# Patient Record
Sex: Male | Born: 1965 | Race: White | Hispanic: No | Marital: Single | Smoking: Current every day smoker
Health system: Southern US, Community
[De-identification: ages and names within clinical notes are randomized; demographics above are authoritative.]

## PROBLEM LIST (undated history)

## (undated) DIAGNOSIS — G009 Bacterial meningitis, unspecified: Secondary | ICD-10-CM

---

## 2016-03-11 ENCOUNTER — Other Ambulatory Visit: Payer: Self-pay | Admitting: Family Medicine

## 2016-03-11 DIAGNOSIS — R1011 Right upper quadrant pain: Secondary | ICD-10-CM

## 2016-03-14 ENCOUNTER — Ambulatory Visit
Admission: RE | Admit: 2016-03-14 | Discharge: 2016-03-14 | Disposition: A | Discharge status: Home / Self Care | Payer: BLUE CROSS/BLUE SHIELD | Source: Ambulatory Visit | Attending: Family Medicine | Admitting: Family Medicine

## 2016-03-14 DIAGNOSIS — R1011 Right upper quadrant pain: Secondary | ICD-10-CM

## 2016-03-14 DIAGNOSIS — K760 Fatty (change of) liver, not elsewhere classified: Secondary | ICD-10-CM | POA: Insufficient documentation

## 2020-03-15 DIAGNOSIS — R519 Headache, unspecified: Secondary | ICD-10-CM | POA: Diagnosis not present

## 2020-03-15 DIAGNOSIS — G4489 Other headache syndrome: Secondary | ICD-10-CM | POA: Diagnosis not present

## 2020-03-15 DIAGNOSIS — Z20822 Contact with and (suspected) exposure to covid-19: Secondary | ICD-10-CM | POA: Diagnosis not present

## 2020-03-15 DIAGNOSIS — K529 Noninfective gastroenteritis and colitis, unspecified: Secondary | ICD-10-CM | POA: Diagnosis not present

## 2020-03-16 ENCOUNTER — Emergency Department
Admission: EM | Admit: 2020-03-16 | Discharge: 2020-03-16 | Disposition: A | Discharge status: Home / Self Care | Payer: 59 | Attending: Emergency Medicine | Admitting: Emergency Medicine

## 2020-03-16 ENCOUNTER — Emergency Department: Payer: 59

## 2020-03-16 ENCOUNTER — Other Ambulatory Visit: Payer: Self-pay

## 2020-03-16 DIAGNOSIS — G43909 Migraine, unspecified, not intractable, without status migrainosus: Secondary | ICD-10-CM | POA: Insufficient documentation

## 2020-03-16 DIAGNOSIS — R519 Headache, unspecified: Secondary | ICD-10-CM | POA: Diagnosis not present

## 2020-03-16 LAB — COMPREHENSIVE METABOLIC PANEL
ALT: 38 U/L (ref 0–44)
AST: 41 U/L (ref 15–41)
Albumin: 4.2 g/dL (ref 3.5–5.0)
Alkaline Phosphatase: 69 U/L (ref 38–126)
Anion gap: 10 (ref 5–15)
BUN: 14 mg/dL (ref 6–20)
CO2: 26 mmol/L (ref 22–32)
Calcium: 8.9 mg/dL (ref 8.9–10.3)
Chloride: 103 mmol/L (ref 98–111)
Creatinine, Ser: 0.93 mg/dL (ref 0.61–1.24)
GFR, Estimated: >60 mL/min (ref >60)
Glucose, Bld: 132 mg/dL — ABNORMAL HIGH (ref 70–99)
Potassium: 4.1 mmol/L (ref 3.5–5.1)
Sodium: 139 mmol/L (ref 135–145)
Total Bilirubin: 1.1 mg/dL (ref 0.3–1.2)
Total Protein: 7.6 g/dL (ref 6.5–8.1)

## 2020-03-16 LAB — CBC WITH DIFFERENTIAL/PLATELET
Abs Immature Granulocytes: 0.04 10*3/uL (ref 0.00–0.07)
Basophils Absolute: 0 10*3/uL (ref 0.0–0.1)
Basophils Relative: 0 %
Eosinophils Absolute: 0.1 10*3/uL (ref 0.0–0.5)
Eosinophils Relative: 1 %
HCT: 44.6 % (ref 39.0–52.0)
Hemoglobin: 15.9 g/dL (ref 13.0–17.0)
Immature Granulocytes: 0 %
Lymphocytes Relative: 15 %
Lymphs Abs: 1.7 10*3/uL (ref 0.7–4.0)
MCH: 32.1 pg (ref 26.0–34.0)
MCHC: 35.7 g/dL (ref 30.0–36.0)
MCV: 90.1 fL (ref 80.0–100.0)
Monocytes Absolute: 0.5 10*3/uL (ref 0.1–1.0)
Monocytes Relative: 5 %
Neutro Abs: 9 10*3/uL — ABNORMAL HIGH (ref 1.7–7.7)
Neutrophils Relative %: 79 %
Platelets: 210 10*3/uL (ref 150–400)
RBC: 4.95 MIL/uL (ref 4.22–5.81)
RDW: 11.8 % (ref 11.5–15.5)
WBC: 11.3 10*3/uL — ABNORMAL HIGH (ref 4.0–10.5)
nRBC: 0 % (ref 0.0–0.2)

## 2020-03-16 LAB — SEDIMENTATION RATE: Sed Rate: 8 mm/hr (ref 0–20)

## 2020-03-16 MED ORDER — PROCHLORPERAZINE EDISYLATE 10 MG/2ML IJ SOLN
10.0000 mg | Freq: Once | INTRAMUSCULAR | Status: AC
  Administered 2020-03-16: 10 mg via INTRAVENOUS
  Filled 2020-03-16: qty 2

## 2020-03-16 MED ORDER — DIPHENHYDRAMINE HCL 50 MG/ML IJ SOLN
25.0000 mg | Freq: Once | INTRAMUSCULAR | Status: AC
  Administered 2020-03-16: 25 mg via INTRAVENOUS
  Filled 2020-03-16: qty 1

## 2020-03-16 MED ORDER — MAGNESIUM SULFATE 2 GM/50ML IV SOLN
2.0000 g | Freq: Once | INTRAVENOUS | Status: AC
  Administered 2020-03-16: 2 g via INTRAVENOUS
  Filled 2020-03-16: qty 50

## 2020-03-16 MED ORDER — LACTATED RINGERS IV BOLUS
1000.0000 mL | Freq: Once | INTRAVENOUS | Status: AC
  Administered 2020-03-16: 1000 mL via INTRAVENOUS

## 2020-03-16 NOTE — ED Notes (Signed)
Pt discharged by [place], rn.

## 2020-03-16 NOTE — ED Provider Notes (Signed)
[facility] Emergency Department Provider Note  ____________________________________________   First MD Initiated Contact with Patient 03/16/20 [phone]     (approximate)  I have reviewed the triage vital signs and the nursing notes.   HISTORY  Chief Complaint Migraine   HPI Ethan Schneider is a 54 y.o. male with a past medical history of childhood migraines and "bad gallbladder" who presents for assessment of approximately 1 week of headaches associate with some nonbloody nonbilious vomiting.  Patient states that prior to this he has not had a headache since he was a child.  He denies any recent traumatic injuries or falls.  He states he went to an urgent care couple days ago and received a shot that slightly improved the headache but it has since returned.  Endorses photophobia and phonophobia and some nausea.  He denies any chest pain, cough, shortness of breath, abdominal pain, back pain, rash, urinary symptoms, or other acute sick symptoms.  He denies EtOH use or illicit drug use but does endorse tobacco abuse.  States he has been taking some ibuprofen but this does not help.  No other clear alleviating or aggravating factors.         No past medical history on file.  There are no problems to display for this patient.   Prior to Admission medications   Not on File    Allergies Patient has no known allergies.  No family history on file.  Social History Social History   Tobacco Use  . Smoking status: Not on file  Substance Use Topics  . Alcohol use: Not on file  . Drug use: Not on file    Review of Systems  Review of Systems  Constitutional: Negative for chills and fever.  HENT: Negative for sore throat.   Eyes: Positive for photophobia. Negative for pain.  Respiratory: Negative for cough and stridor.   Cardiovascular: Negative for chest pain.  Gastrointestinal: Positive for nausea and vomiting.  Skin: Negative for rash.    Neurological: Positive for headaches. Negative for seizures and loss of consciousness.  Psychiatric/Behavioral: Negative for suicidal ideas.  All other systems reviewed and are negative.     ____________________________________________   PHYSICAL EXAM:  VITAL SIGNS: ED Triage Vitals [03/16/20 0240]  Enc Vitals Group     BP (!) 139/98     Pulse Rate 78     Resp 17     Temp 98.6 F (37 C)     Temp Source Oral     SpO2 98 %     Weight 220 lb (99.8 kg)     Height 5\' 10"  (1.778 m)     Head Circumference      Peak Flow      Pain Score 10     Pain Loc      Pain Edu?      Excl. in GC?    Vitals:   03/16/20 0240  BP: (!) 139/98  Pulse: 78  Resp: 17  Temp: 98.6 F (37 C)  SpO2: 98%   Physical Exam Vitals and nursing note reviewed.  Constitutional:      Appearance: He is well-developed.  HENT:     Head: Normocephalic and atraumatic.     Right Ear: External ear normal.     Left Ear: External ear normal.     Nose: Nose normal.  Eyes:     Conjunctiva/sclera: Conjunctivae normal.  Cardiovascular:     Rate and Rhythm: Normal rate and regular rhythm.  Heart sounds: No murmur heard.   Pulmonary:     Effort: Pulmonary effort is normal. No respiratory distress.     Breath sounds: Normal breath sounds.  Abdominal:     Palpations: Abdomen is soft.     Tenderness: There is no abdominal tenderness.  Musculoskeletal:     Cervical back: Neck supple.  Skin:    General: Skin is warm and dry.     Capillary Refill: Capillary refill takes less than 2 seconds.  Neurological:     Mental Status: He is alert and oriented to person, place, and time.  Psychiatric:        Mood and Affect: Mood normal.     No pronator drift.  No finger dysmetria.  Cranial nerves II through XII grossly intact.  Patient has full symmetric strength on his bilateral upper and lower extremities.  Sensation is intact to light touch of all extremities. ____________________________________________    LABS (all labs ordered are listed, but only abnormal results are displayed)  Labs Reviewed  CBC WITH DIFFERENTIAL/PLATELET - Abnormal; Notable for the following components:      Result Value   WBC 11.3 (*)    Neutro Abs 9.0 (*)    All other components within normal limits  COMPREHENSIVE METABOLIC PANEL - Abnormal; Notable for the following components:   Glucose, Bld 132 (*)    All other components within normal limits  SEDIMENTATION RATE   ____________________________________________  ____________________________________________  RADIOLOGY  ED MD interpretation: No acute intracranial abnormalities.  Official radiology report(s): CT Head Wo Contrast  Result Date: 03/16/2020 CLINICAL DATA:  Headache for several days. Suspected intracranial hemorrhage. Nausea, vomiting, and photosensitivity. EXAM: CT HEAD WITHOUT CONTRAST TECHNIQUE: Contiguous axial images were obtained from the base of the skull through the vertex without intravenous contrast. COMPARISON:  None. FINDINGS: Brain: No evidence of acute infarction, hemorrhage, hydrocephalus, extra-axial collection or mass lesion/mass effect. Vascular: Mild intracranial arterial vascular calcifications. Skull: Normal. Negative for fracture or focal lesion. Sinuses/Orbits: No acute finding. Other: None. IMPRESSION: No acute intracranial abnormalities. Electronically Signed   By: Burman Nieves M.D.   On: 03/16/2020 05:40    ____________________________________________   PROCEDURES  Procedure(s) performed (including Critical Care):  Procedures   ____________________________________________   INITIAL IMPRESSION / ASSESSMENT AND PLAN / ED COURSE        Patient presents with [place] to history exam for assessment of severe migraine headache that has been bothering him for several days close to a week.  On arrival patient is afebrile hemodynamically stable.  He has no evidence of any neurological deficits.  Impression is likely  migraine headache.  No history or exam findings suggest acute traumatic injury or acute infectious process.  Very low suspicion for temporal arteritis.  There is no autonomic or sinus symptoms to suggest cluster headache.  CT head shows no evidence of hemorrhage or mass-effect.  No focal deficits to suggest CVA and patient does not have any significant ocular metabolic derangements identified on CMP.  CBC shows evidence of anemia.  Is a mild leukocytosis which is nonspecific and do not believe represents an acute infectious process.  Patient given below noted headache cocktail.  On reassessment he stated he felt much better.  Given improvement in symptoms with below medications otherwise stable vital signs and reassuring exam and work-up elevated safe for discharge with plan for outpatient follow-up.  Discharged stable condition.  Return cautions advised discussed.   ____________________________________________   FINAL CLINICAL IMPRESSION(S) / ED DIAGNOSES  Final  diagnoses:  Migraine without status migrainosus, not intractable, unspecified migraine type    Medications  magnesium sulfate IVPB 2 g 50 mL (2 g Intravenous New Bag/Given 03/16/20 0516)  lactated ringers bolus 1,000 mL (1,000 mLs Intravenous New Bag/Given 03/16/20 0515)  prochlorperazine (COMPAZINE) injection 10 mg (10 mg Intravenous Given 03/16/20 0514)  diphenhydrAMINE (BENADRYL) injection 25 mg (25 mg Intravenous Given 03/16/20 0514)     ED Discharge Orders    None       Note:  This document was prepared using Dragon voice recognition software and may include unintentional dictation errors.   Gilles Chiquito, MD 03/16/20 [phone]

## 2020-03-16 NOTE — ED Notes (Signed)
Patient verbalizes understanding of discharge instructions. Opportunity for questioning and answers were provided. Armband removed by staff, pt discharged from ED. Ambulated out to lobby  

## 2020-03-16 NOTE — ED Notes (Signed)
Patient transported to CT 

## 2020-03-16 NOTE — ED Triage Notes (Signed)
Patient reports headache for several days.  Seen at [facility] yesterday and received a "shot" that helped but headache is now back.  Reports has had some nausea, vomiting and photosensitivity.

## 2020-03-18 DIAGNOSIS — J039 Acute tonsillitis, unspecified: Secondary | ICD-10-CM | POA: Diagnosis not present

## 2020-03-26 ENCOUNTER — Ambulatory Visit
Admission: EM | Admit: 2020-03-26 | Discharge: 2020-03-26 | Disposition: A | Discharge status: Home / Self Care | Payer: 59

## 2020-03-26 ENCOUNTER — Other Ambulatory Visit: Payer: Self-pay

## 2020-03-26 ENCOUNTER — Ambulatory Visit
Admission: RE | Admit: 2020-03-26 | Discharge: 2020-03-26 | Disposition: A | Discharge status: Home / Self Care | Payer: 59 | Source: Ambulatory Visit

## 2020-03-26 ENCOUNTER — Encounter: Payer: Self-pay | Admitting: Emergency Medicine

## 2020-03-26 DIAGNOSIS — J0110 Acute frontal sinusitis, unspecified: Secondary | ICD-10-CM | POA: Diagnosis not present

## 2020-03-26 HISTORY — DX: Bacterial meningitis, unspecified: G00.9

## 2020-03-26 MED ORDER — CEFDINIR 300 MG PO CAPS: 300.0000 mg | ORAL_CAPSULE | Freq: Two times a day (BID) | ORAL | 0 refills | Status: AC

## 2020-03-26 MED ORDER — NYSTATIN 100000 UNIT/ML MT SUSP: 500000.0000 [IU] | Freq: Four times a day (QID) | OROMUCOSAL | 0 refills | Status: AC

## 2020-03-26 NOTE — ED Triage Notes (Signed)
Pt c/o headache, double vision and nausea. He states it started about a week ago. He states he has been seen at the ED, had blood work and a CT. He states he looked on his my chart and states he "clearly has a bacterial infection and was not treated for it"

## 2020-03-26 NOTE — Discharge Instructions (Signed)
Please see a neurologist if your headaches come back with vision changes  Monitor your blood pressure and should be less than 140/90

## 2020-03-26 NOTE — ED Provider Notes (Signed)
[facility]    CSN: [id number] Arrival date & time: 03/26/20  4270      History   Chief Complaint Chief Complaint  Patient presents with  . Headache    HPI Ethan Schneider is a 54 y.o. male who  presents with forehead HA and double vision for the past couple of weeks. He  was seen in ER on 11/22 and had slight elevation of his WBC  And glucose and neg CT of head, and responded to migraine med when discharged. He states the HA comes and goes, and being in the heat makes it worse. Had telemed visit last week and was placed on Augmentin for sinusitis which heped the HA and double vision resolve, but due to having white coating on his mouth, burning of tongue and white matter on throat, he stopped it after 4 days and his HA and vision changes came back.    Past Medical History:  Diagnosis Date  . Bacterial meningitis    at age 29    There are no problems to display for this patient.   History reviewed. No pertinent surgical history.     Home Medications    Prior to Admission medications   Medication Sig Start Date End Date Taking? Authorizing Provider  Multiple Vitamin (MULTI VITAMIN DAILY PO) Take by mouth.   Yes [provider]    Family History Family History  Problem Relation Age of Onset  . Cancer Mother     Social History Social History   Tobacco Use  . Smoking status: Current Every Day Smoker    Types: Cigarettes  . Smokeless tobacco: Never Used  Vaping Use  . Vaping Use: Never used  Substance Use Topics  . Alcohol use: Not Currently  . Drug use: Not Currently     Allergies   Patient has no known allergies.   Review of Systems Review of Systems  Constitutional: Negative for activity change, appetite change, chills, diaphoresis, fatigue and fever.  HENT: Positive for postnasal drip. Negative for congestion, ear pain, facial swelling and sore throat.   Eyes: Positive for visual disturbance. Negative for photophobia, pain  and discharge.  Respiratory: Negative for cough, chest tightness and shortness of breath.   Gastrointestinal: Negative for nausea and vomiting.  Endocrine: Negative for polydipsia and polyphagia.  Musculoskeletal: Negative for gait problem and myalgias.  Skin: Negative for rash.  Neurological: Positive for headaches.  Hematological: Negative for adenopathy.     Physical Exam Triage Vital Signs ED Triage Vitals  Enc Vitals Group     BP 03/26/20 1029 (!) 157/94     Pulse Rate 03/26/20 1029 79     Resp 03/26/20 1029 18     Temp 03/26/20 1029 99 F (37.2 C)     Temp Source 03/26/20 1029 Oral     SpO2 03/26/20 1029 99 %     Weight 03/26/20 1024 220 lb 0.3 oz (99.8 kg)     Height 03/26/20 1024 5\' 11"  (1.803 m)     Head Circumference --      Peak Flow --      Pain Score 03/26/20 1023 5     Pain Loc --      Pain Edu? --      Excl. in GC? --    No data found.  Updated Vital Signs BP (!) 157/94 (BP Location: Left Arm)   Pulse 79   Temp 99 F (37.2 C) (Oral)   Resp 18  Ht 5\' 11"  (1.803 m)   Wt 220 lb 0.3 oz (99.8 kg)   SpO2 99%   BMI 30.69 kg/m   Visual Acuity Right Eye Distance:   Left Eye Distance:   Bilateral Distance:    Right Eye Near:   Left Eye Near:    Bilateral Near:     Physical Exam Physical Exam Vitals signs and nursing note reviewed.  Constitutional:      General: He is not in acute distress.    Appearance: He is well-developed and normal weight. He is not ill-appearing, toxic-appearing or diaphoretic.  HENT:     Head: Normocephalic. Frontal sinuses are tender Eyes:     Extraocular Movements: Extraocular movements intact.     Pupils: Pupils are equal, round, and reactive to light.  Neck:     Musculoskeletal: Neck supple. No neck rigidity.   Cardiovascular:     Rate and Rhythm: Normal rate and regular rhythm.     Heart sounds: No murmur.  Pulmonary:     Effort: Pulmonary effort is normal.     Breath sounds: Normal breath sounds. No wheezing,  rhonchi or rales.  Abdominal:     General: Bowel sounds are normal.     Palpations: Abdomen is soft. There is no mass.     Tenderness: There is no abdominal tenderness. There is no guarding.  Musculoskeletal: Normal range of motion.  Lymphadenopathy:     Cervical: No cervical adenopathy.  Skin:    General: Skin is warm and dry.  Neurological:     Mental Status: He is alert.     Cranial Nerves: No cranial nerve deficit or facial asymmetry.     Sensory: No sensory deficit.     Motor: No weakness.     Coordination: Romberg sign negative. Coordination normal.     Gait: Gait normal.     Deep Tendon Reflexes: Reflexes normal.     Comments: Normal Romberg,  finger to nose, tandem gait.   Psychiatric:        Mood and Affect: Mood normal.        Speech: Speech normal.        Behavior: Behavior normal.     UC Treatments / Results  Labs (all labs ordered are listed, but only abnormal results are displayed) Labs Reviewed - No data to display  EKG   Radiology No results found.  Procedures Procedures (including critical care time)  Medications Ordered in UC Medications - No data to display  Initial Impression / Assessment and Plan / UC Course  I have reviewed the triage vital signs and the nursing notes. I reviewed the ER records and his labs as well as the head CT. Due to responding to the Augmentin while on it, I placed him on Cefdinir as noted. Needs to Fu with his PCP regarding his slight elevation of glucose and should have a HGBA1C done with them.    Final Clinical Impressions(s) / UC Diagnoses   Final diagnoses:  None   Discharge Instructions   None    ED Prescriptions    None     PDMP not reviewed this encounter.   , Garey Ham 03/26/20 1333

## 2020-09-20 DIAGNOSIS — J0100 Acute maxillary sinusitis, unspecified: Secondary | ICD-10-CM | POA: Diagnosis not present

## 2021-03-21 IMAGING — CT CT HEAD W/O CM
3 of 6 series · 13 of 47 positions shown, 15 images · non-contrast
Comparison: None.

CLINICAL DATA: Headache for several days. Suspected intracranial
hemorrhage. Nausea, vomiting, and photosensitivity.

EXAM:
CT HEAD WITHOUT CONTRAST
TECHNIQUE: Contiguous axial images were obtained from the base of the skull
through the vertex without intravenous contrast.

[Series 3: head wo · axial · 0.41mm/px · z∈[-127,+3]mm · 8 of 32 slices shown, 10 images]
[im 3/32  brain]
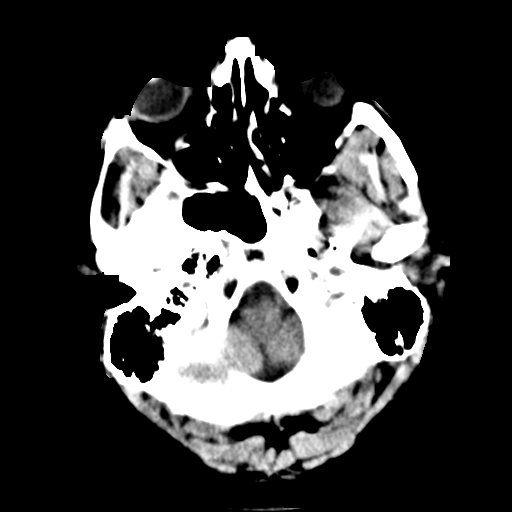
[im 3/32  bone]
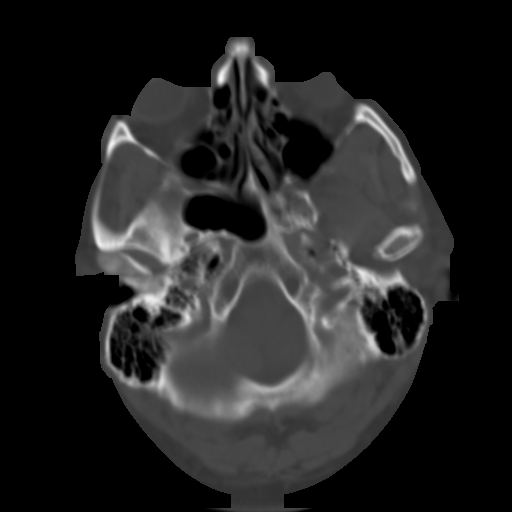
[im 6/32  brain]
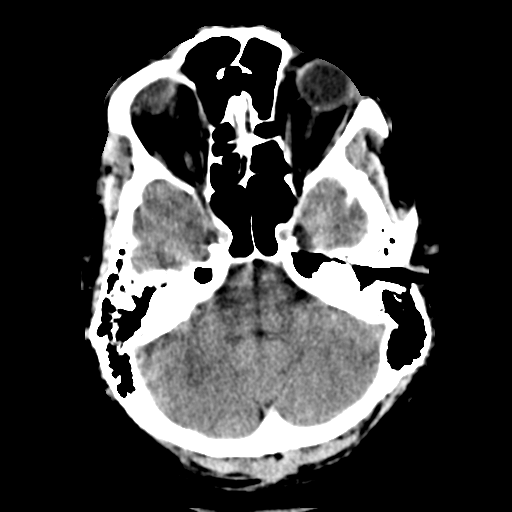
[im 10/32  brain]
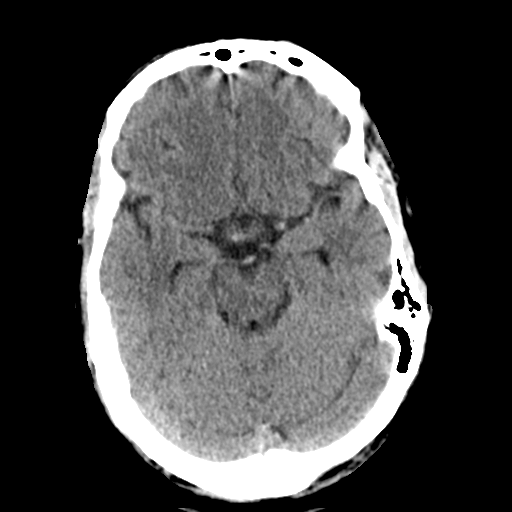
[im 15/32  brain]
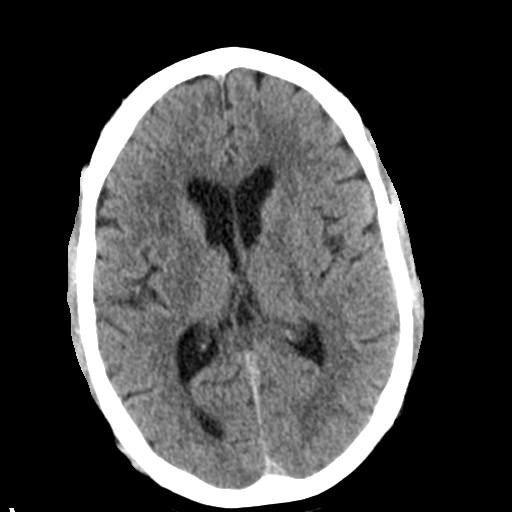
[im 17/32  brain]
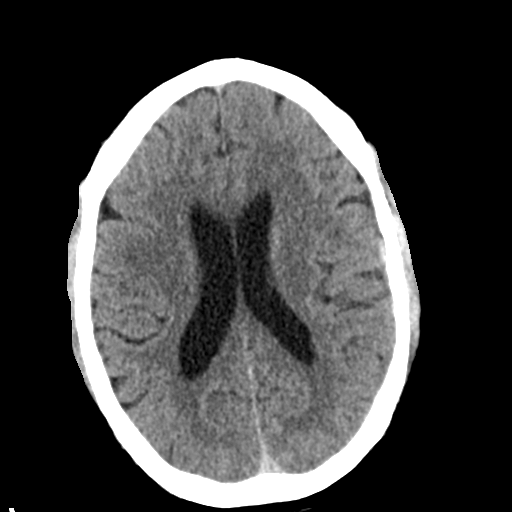
[im 17/32  bone]
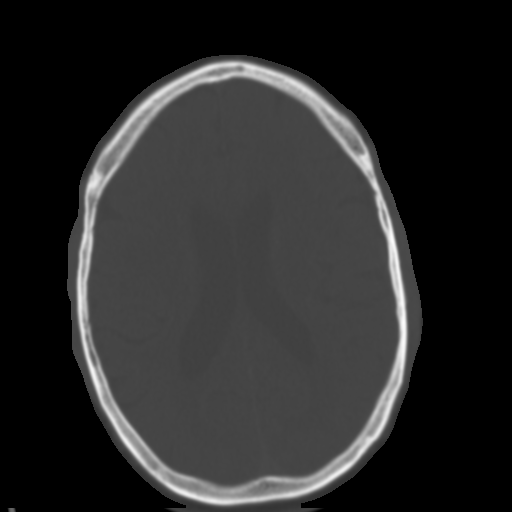
[im 22/32  brain]
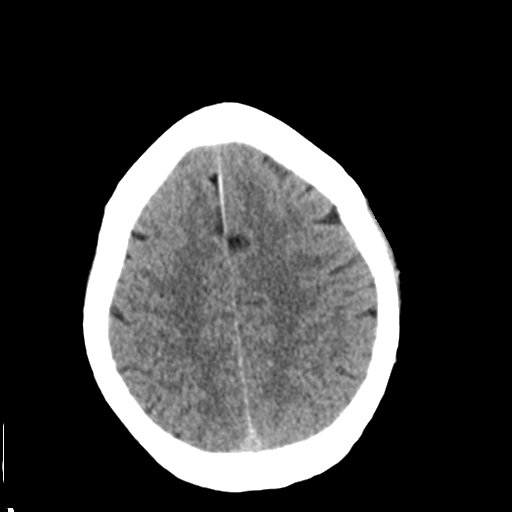
[im 26/32  brain]
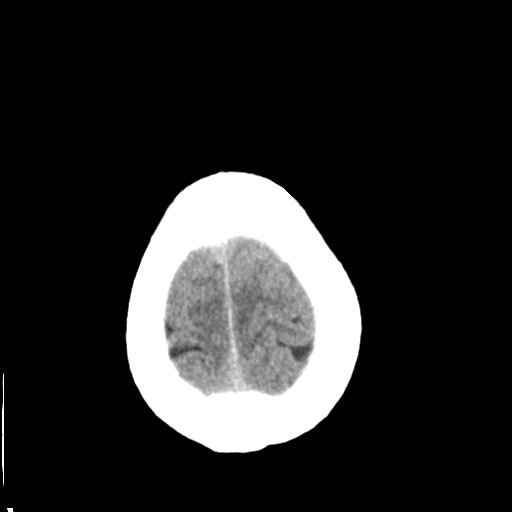
[im 29/32  brain]
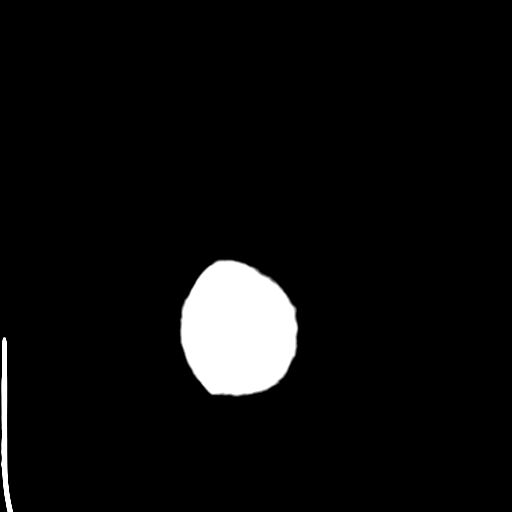

[Series 4: coronal soft tissue · coronal · 0.29mm/px · 3 of 68 slices shown]
[im 6/68  brain]
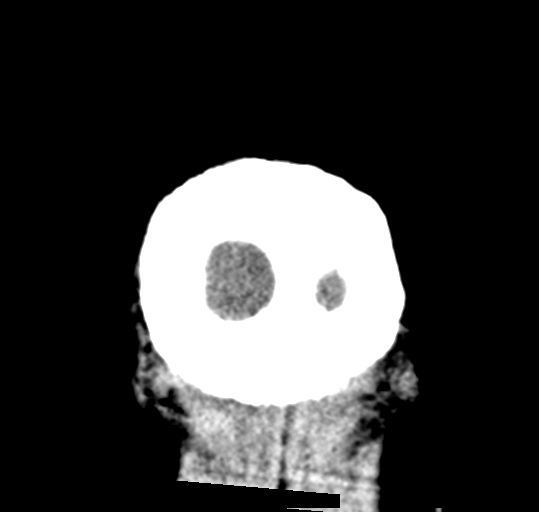
[im 11/68  brain]
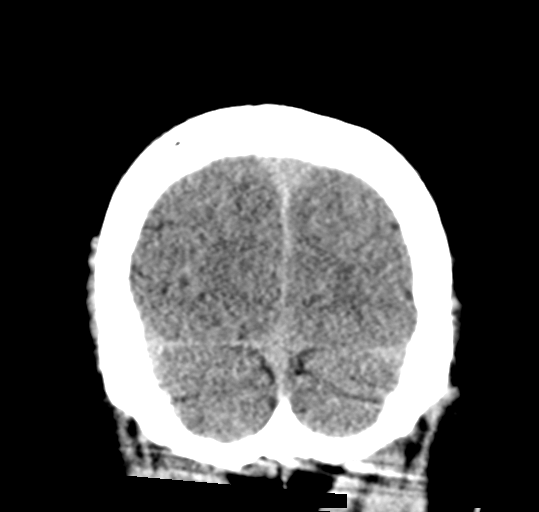
[im 16/68  brain]
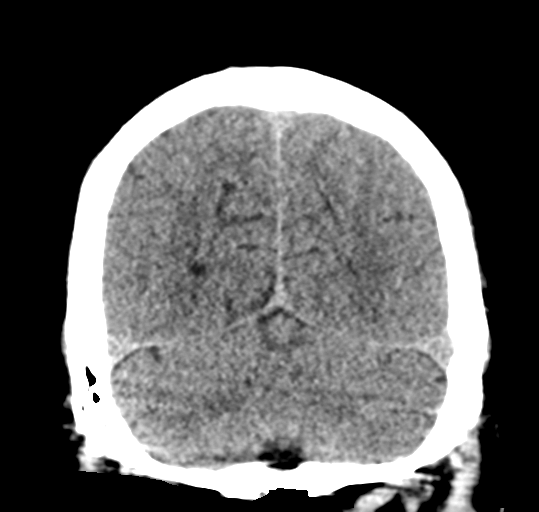

[Series 9: sagittal soft tissue · sagittal · 0.11mm/px · 2 of 55 slices shown]
[im 19/55  brain]
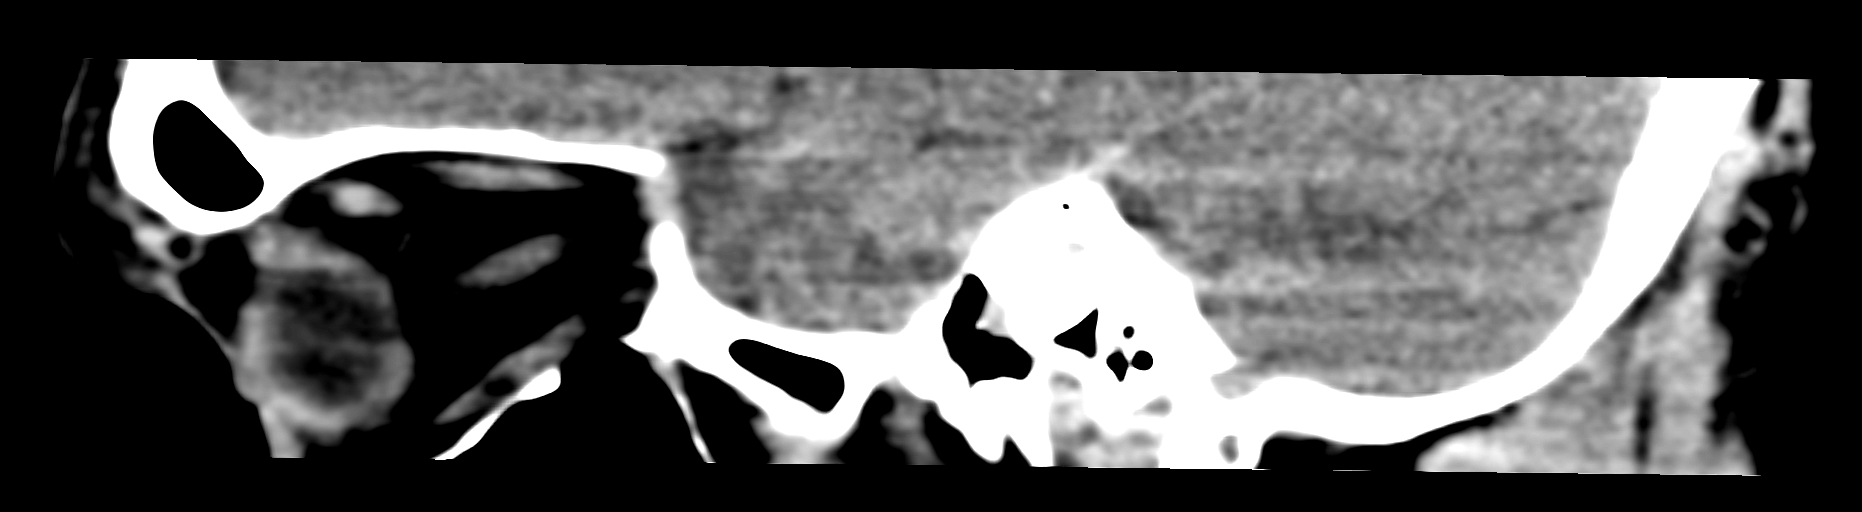
[im 37/55  brain]
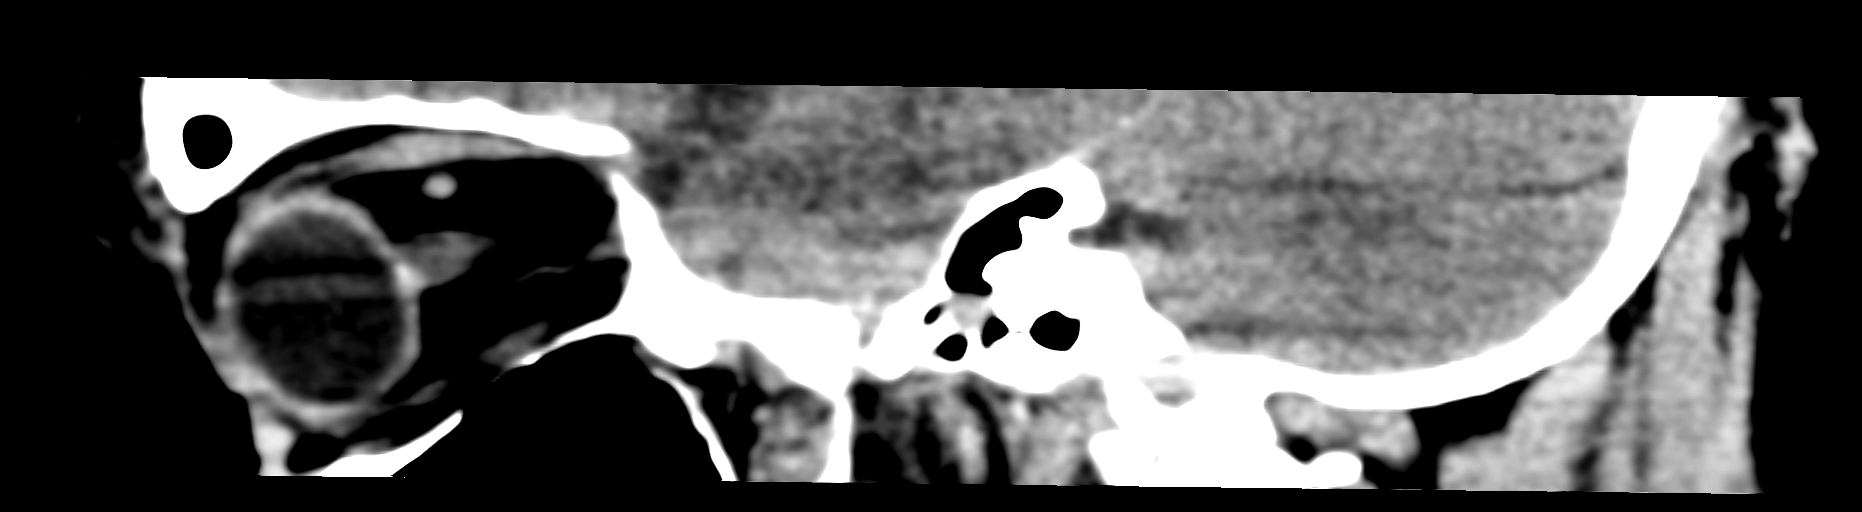

[13 of 47 positions shown; findings below may reference images not displayed]

FINDINGS: Brain: No evidence of acute infarction, hemorrhage, hydrocephalus,
extra-axial collection or mass lesion/mass effect.

Vascular: Mild intracranial arterial vascular calcifications.

Skull: Normal. Negative for fracture or focal lesion.

Sinuses/Orbits: No acute finding.

Other: None.
IMPRESSION: No acute intracranial abnormalities.

## 2023-01-31 DIAGNOSIS — M25572 Pain in left ankle and joints of left foot: Secondary | ICD-10-CM | POA: Diagnosis not present

## 2023-01-31 DIAGNOSIS — Z23 Encounter for immunization: Secondary | ICD-10-CM | POA: Diagnosis not present
